# Patient Record
Sex: Male | Born: 1985 | Race: White | Hispanic: No | Marital: Single | Smoking: Current every day smoker
Health system: Southern US, Community
[De-identification: ages and names within clinical notes are randomized; demographics above are authoritative.]

## PROBLEM LIST (undated history)

## (undated) DIAGNOSIS — R42 Dizziness and giddiness: Secondary | ICD-10-CM

## (undated) DIAGNOSIS — G510 Bell's palsy: Secondary | ICD-10-CM

## (undated) HISTORY — DX: Dizziness and giddiness: R42

---
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
## 2004-03-11 ENCOUNTER — Emergency Department (HOSPITAL_COMMUNITY): Admission: EM | Admit: 2004-03-11 | Discharge: 2004-03-12 | Payer: Self-pay | Admitting: Emergency Medicine

## 2004-07-06 ENCOUNTER — Emergency Department (HOSPITAL_COMMUNITY): Admission: EM | Admit: 2004-07-06 | Discharge: 2004-07-06 | Payer: Self-pay | Admitting: Emergency Medicine

## 2004-11-01 ENCOUNTER — Emergency Department (HOSPITAL_COMMUNITY): Admission: EM | Admit: 2004-11-01 | Discharge: 2004-11-01 | Payer: Self-pay | Admitting: Emergency Medicine

## 2008-01-13 ENCOUNTER — Emergency Department (HOSPITAL_COMMUNITY): Admission: EM | Admit: 2008-01-13 | Discharge: 2008-01-13 | Payer: Self-pay | Admitting: Emergency Medicine

## 2008-03-29 ENCOUNTER — Emergency Department (HOSPITAL_COMMUNITY): Admission: EM | Admit: 2008-03-29 | Discharge: 2008-03-29 | Payer: Self-pay | Admitting: Emergency Medicine

## 2008-11-02 ENCOUNTER — Inpatient Hospital Stay (HOSPITAL_COMMUNITY): Admission: AC | Admit: 2008-11-02 | Discharge: 2008-11-03 | Payer: Self-pay

## 2008-11-06 ENCOUNTER — Encounter (INDEPENDENT_AMBULATORY_CARE_PROVIDER_SITE_OTHER): Payer: Self-pay | Admitting: Emergency Medicine

## 2008-11-06 ENCOUNTER — Ambulatory Visit: Payer: Self-pay | Admitting: Surgery

## 2008-11-06 ENCOUNTER — Emergency Department (HOSPITAL_COMMUNITY): Admission: EM | Admit: 2008-11-06 | Discharge: 2008-11-06 | Payer: Self-pay | Admitting: Emergency Medicine

## 2009-03-28 ENCOUNTER — Emergency Department (HOSPITAL_COMMUNITY): Admission: EM | Admit: 2009-03-28 | Discharge: 2009-03-29 | Payer: Self-pay | Admitting: Emergency Medicine

## 2010-05-08 ENCOUNTER — Emergency Department (HOSPITAL_COMMUNITY): Admission: EM | Admit: 2010-05-08 | Discharge: 2010-05-08 | Payer: Self-pay | Admitting: Emergency Medicine

## 2010-10-05 ENCOUNTER — Encounter: Payer: Self-pay | Admitting: Urology

## 2010-12-16 ENCOUNTER — Emergency Department (HOSPITAL_COMMUNITY)
Admission: EM | Admit: 2010-12-16 | Discharge: 2010-12-16 | Disposition: A | Discharge status: Home / Self Care | Payer: Self-pay | Attending: Emergency Medicine | Admitting: Emergency Medicine

## 2010-12-16 DIAGNOSIS — G510 Bell's palsy: Secondary | ICD-10-CM | POA: Insufficient documentation

## 2010-12-16 DIAGNOSIS — R29810 Facial weakness: Secondary | ICD-10-CM | POA: Insufficient documentation

## 2010-12-16 DIAGNOSIS — R209 Unspecified disturbances of skin sensation: Secondary | ICD-10-CM | POA: Insufficient documentation

## 2010-12-30 LAB — CBC
HCT: 42.6 % (ref 39.0–52.0)
HCT: 47.3 % (ref 39.0–52.0)
Hemoglobin: 14.6 g/dL (ref 13.0–17.0)
Hemoglobin: 14.9 g/dL (ref 13.0–17.0)
Hemoglobin: 16.5 g/dL (ref 13.0–17.0)
MCHC: 34.9 g/dL (ref 30.0–36.0)
MCHC: 35.1 g/dL (ref 30.0–36.0)
MCV: 91.2 fL (ref 78.0–100.0)
Platelets: 214 10*3/uL (ref 150–400)
RBC: 4.55 MIL/uL (ref 4.22–5.81)
RBC: 4.67 MIL/uL (ref 4.22–5.81)
RBC: 5.15 MIL/uL (ref 4.22–5.81)
RDW: 14.4 % (ref 11.5–15.5)
RDW: 14.6 % (ref 11.5–15.5)
WBC: 7.5 10*3/uL (ref 4.0–10.5)
WBC: 9.9 10*3/uL (ref 4.0–10.5)

## 2010-12-30 LAB — BASIC METABOLIC PANEL
CO2: 22 mEq/L (ref 19–32)
Calcium: 8.1 mg/dL — ABNORMAL LOW (ref 8.4–10.5)
Creatinine, Ser: 0.96 mg/dL (ref 0.4–1.5)
GFR calc non Af Amer: >60 mL/min (ref >60)
Glucose, Bld: 98 mg/dL (ref 70–99)

## 2010-12-30 LAB — PROTIME-INR: INR: 1.1 (ref 0.00–1.49)

## 2010-12-30 LAB — POCT I-STAT, CHEM 8
Creatinine, Ser: 1.1 mg/dL (ref 0.4–1.5)
Glucose, Bld: 100 mg/dL — ABNORMAL HIGH (ref 70–99)
HCT: 51 % (ref 39.0–52.0)
Hemoglobin: 17.3 g/dL — ABNORMAL HIGH (ref 13.0–17.0)
Sodium: 137 meq/L (ref 135–145)
TCO2: 22 mmol/L (ref 0–100)

## 2010-12-30 LAB — URINALYSIS, MICROSCOPIC ONLY
Bilirubin Urine: NEGATIVE
Glucose, UA: NEGATIVE mg/dL
Ketones, ur: NEGATIVE mg/dL
Leukocytes, UA: NEGATIVE
Nitrite: NEGATIVE
Specific Gravity, Urine: 1.027 (ref 1.005–1.030)
pH: 6.5 (ref 5.0–8.0)

## 2010-12-30 LAB — SAMPLE TO BLOOD BANK

## 2010-12-30 LAB — DIFFERENTIAL
Eosinophils Absolute: 0.1 10*3/uL (ref 0.0–0.7)
Lymphs Abs: 1.5 10*3/uL (ref 0.7–4.0)
Monocytes Absolute: 0.6 10*3/uL (ref 0.1–1.0)
Monocytes Relative: 8 % (ref 3–12)
Neutrophils Relative %: 69 % (ref 43–77)
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
## 2011-09-01 ENCOUNTER — Emergency Department (HOSPITAL_COMMUNITY)
Admission: EM | Admit: 2011-09-01 | Discharge: 2011-09-01 | Disposition: A | Discharge status: Home / Self Care | Payer: Self-pay | Attending: Emergency Medicine | Admitting: Emergency Medicine

## 2011-09-01 ENCOUNTER — Encounter: Payer: Self-pay | Admitting: *Deleted

## 2011-09-01 DIAGNOSIS — M549 Dorsalgia, unspecified: Secondary | ICD-10-CM | POA: Insufficient documentation

## 2011-09-01 DIAGNOSIS — S335XXA Sprain of ligaments of lumbar spine, initial encounter: Secondary | ICD-10-CM | POA: Insufficient documentation

## 2011-09-01 DIAGNOSIS — S39012A Strain of muscle, fascia and tendon of lower back, initial encounter: Secondary | ICD-10-CM

## 2011-09-01 DIAGNOSIS — Y93A2 Activity, calisthenics: Secondary | ICD-10-CM | POA: Insufficient documentation

## 2011-09-01 DIAGNOSIS — X503XXA Overexertion from repetitive movements, initial encounter: Secondary | ICD-10-CM | POA: Insufficient documentation

## 2011-09-01 HISTORY — DX: Bell's palsy: G51.0

## 2011-09-01 MED ORDER — CYCLOBENZAPRINE HCL 5 MG PO TABS: 5.0000 mg | ORAL_TABLET | Freq: Three times a day (TID) | ORAL | Status: AC | PRN

## 2011-09-01 NOTE — ED Notes (Signed)
Pt reports that he was doing sit ups this past Saturday and felt soreness in lower back. States that pain became worse and was unable to function until today. States pain is 6/10. States he has been taking aspirin and ibuprofen to alleviate the pain. States pain was 9/10 on Saturday, but now a 6/10.

## 2011-10-15 NOTE — ED Provider Notes (Signed)
History     CSN: [id number]  Arrival date & time 09/01/11  1542   None     Chief Complaint  Patient presents with  . Back Pain    (Consider location/radiation/quality/duration/timing/severity/associated sxs/prior treatment) Patient is a 26 y.o. male presenting with back pain. The history is provided by the patient.  Back Pain  This is a recurrent problem. The current episode started more than 2 days ago. The problem occurs constantly. The problem has been gradually improving. Associated with: He reports straining his back while doing vigorous sit ups last weekend. The pain is present in the lumbar spine. The quality of the pain is described as aching. The pain does not radiate. The pain is at a severity of 7/10. The pain is moderate. The symptoms are aggravated by bending and certain positions. Pertinent negatives include no chest pain, no fever, no numbness, no headaches, no abdominal pain, no bladder incontinence, no dysuria, no paresthesias, no paresis, no tingling and no weakness. He has tried NSAIDs and bed rest for the symptoms. The treatment provided mild relief.    Past Medical History  Diagnosis Date  . Bell's palsy     History reviewed. No pertinent past surgical history.  History reviewed. No pertinent family history.  History  Substance Use Topics  . Smoking status: Current Everyday Smoker -- 1.0 packs/day  . Smokeless tobacco: Never Used  . Alcohol Use: 5.4 oz/week    9 Cans of beer per week      Review of Systems  Constitutional: Negative for fever.  HENT: Negative for congestion, sore throat and neck pain.   Eyes: Negative.   Respiratory: Negative for chest tightness and shortness of breath.   Cardiovascular: Negative for chest pain.  Gastrointestinal: Negative for nausea and abdominal pain.  Genitourinary: Negative.  Negative for bladder incontinence and dysuria.  Musculoskeletal: Positive for back pain. Negative for joint swelling and arthralgias.    Skin: Negative.  Negative for rash and wound.  Neurological: Negative for dizziness, tingling, weakness, light-headedness, numbness, headaches and paresthesias.  Hematological: Negative.   Psychiatric/Behavioral: Negative.     Allergies  Review of patient's allergies indicates no known allergies.  Home Medications   Current Outpatient Rx  Name Route Sig Dispense Refill  . IBUPROFEN 200 MG PO TABS Oral Take 200 mg by mouth every 6 (six) hours as needed. PAIN       BP 124/71  Pulse 77  Temp(Src) 98.6 F (37 C) (Oral)  Resp 20  Ht 6' (1.829 m)  Wt 155 lb (70.308 kg)  BMI 21.02 kg/m2  SpO2 100%  Physical Exam  Nursing note and vitals reviewed. Constitutional: He is oriented to person, place, and time. He appears well-developed and well-nourished.  HENT:  Head: Normocephalic.  Eyes: Conjunctivae are normal.  Neck: Normal range of motion. Neck supple.  Cardiovascular: Regular rhythm and intact distal pulses.        Pedal pulses normal.  Pulmonary/Chest: Effort normal. He has no wheezes.  Abdominal: Soft. Bowel sounds are normal. He exhibits no distension and no mass.  Musculoskeletal: Normal range of motion. He exhibits no edema.       Lumbar back: He exhibits tenderness. He exhibits no swelling, no edema and no spasm.  Neurological: He is alert and oriented to person, place, and time. He has normal strength. He displays no atrophy and no tremor. No cranial nerve deficit or sensory deficit. Gait normal.  Reflex Scores:      Patellar reflexes are 2+  on the right side and 2+ on the left side.      Achilles reflexes are 2+ on the right side and 2+ on the left side.      No strength deficit noted in hip and knee flexor and extensor muscle groups.  Ankle flexion and extension intact.  Skin: Skin is warm and dry.  Psychiatric: He has a normal mood and affect.    ED Course  Procedures (including critical care time)  Labs Reviewed - No data to display No results found.   1.  Lumbar strain       MDM  No neuro deficit on exam or by history to suggest emergent or surgical presentation.           Candis Musa, PA 10/15/11 2143

## 2011-10-16 NOTE — ED Provider Notes (Signed)
Medical screening examination/treatment/procedure(s) were performed by non-physician practitioner and as supervising physician I was immediately available for consultation/collaboration.  Ethelda Chick, MD 10/16/11 [phone]

## 2012-02-09 IMAGING — CR DG KNEE 1-2V*L*
2 series · 2 of 2 positions shown · non-contrast
Comparison: None.

CLINICAL DATA: Generalized left knee pain with no known injury.

LEFT KNEE - 1-2 VIEW

[t knee ap left]
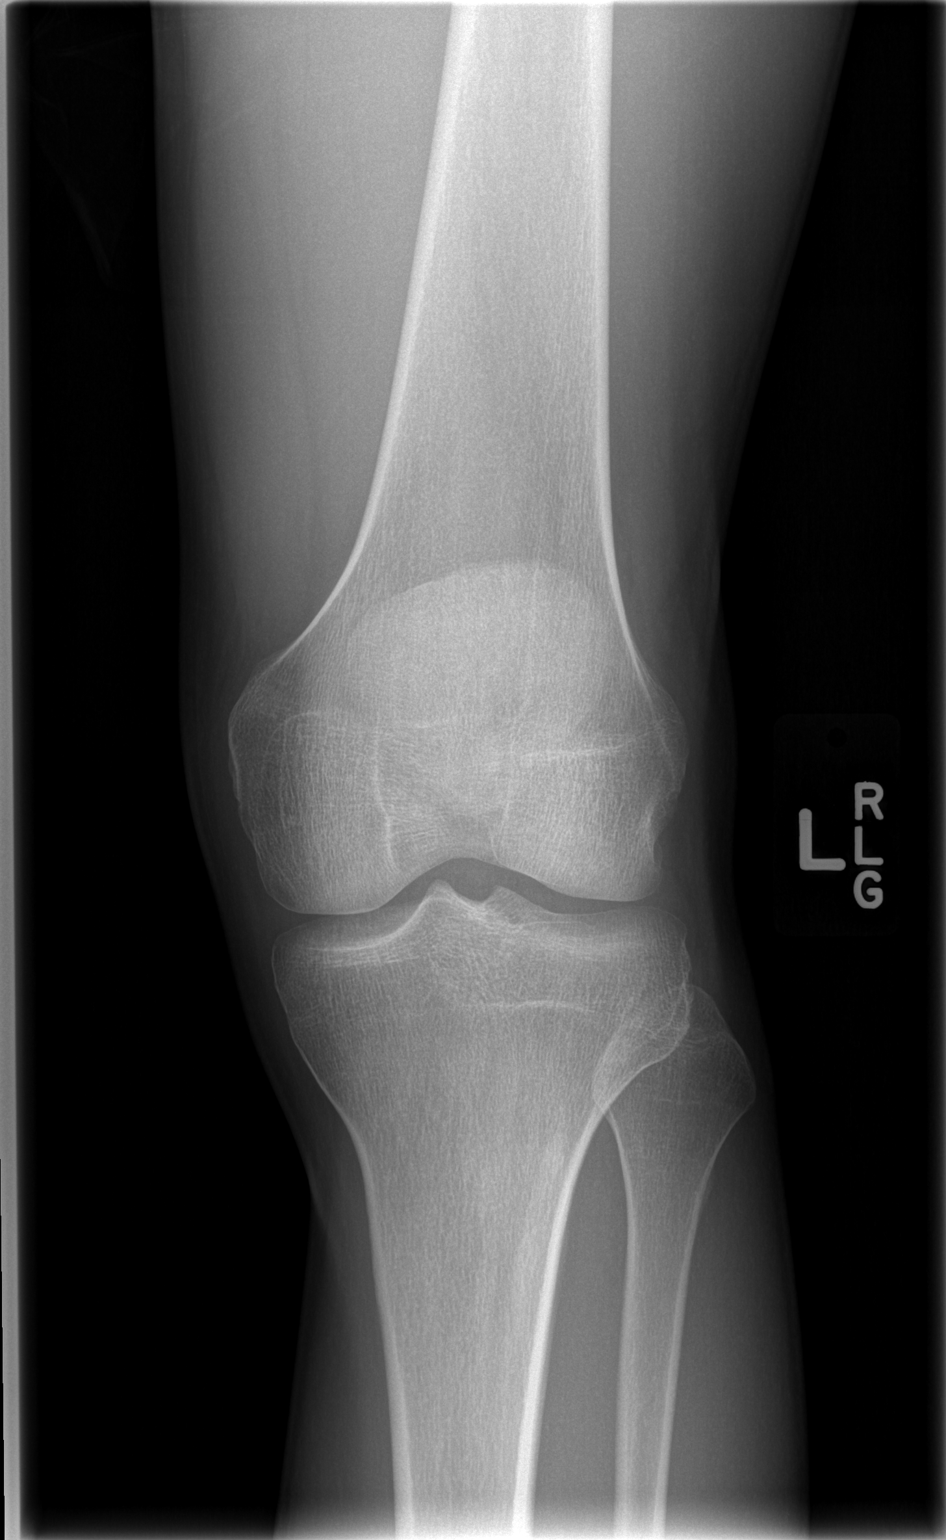

[t knee lat left]
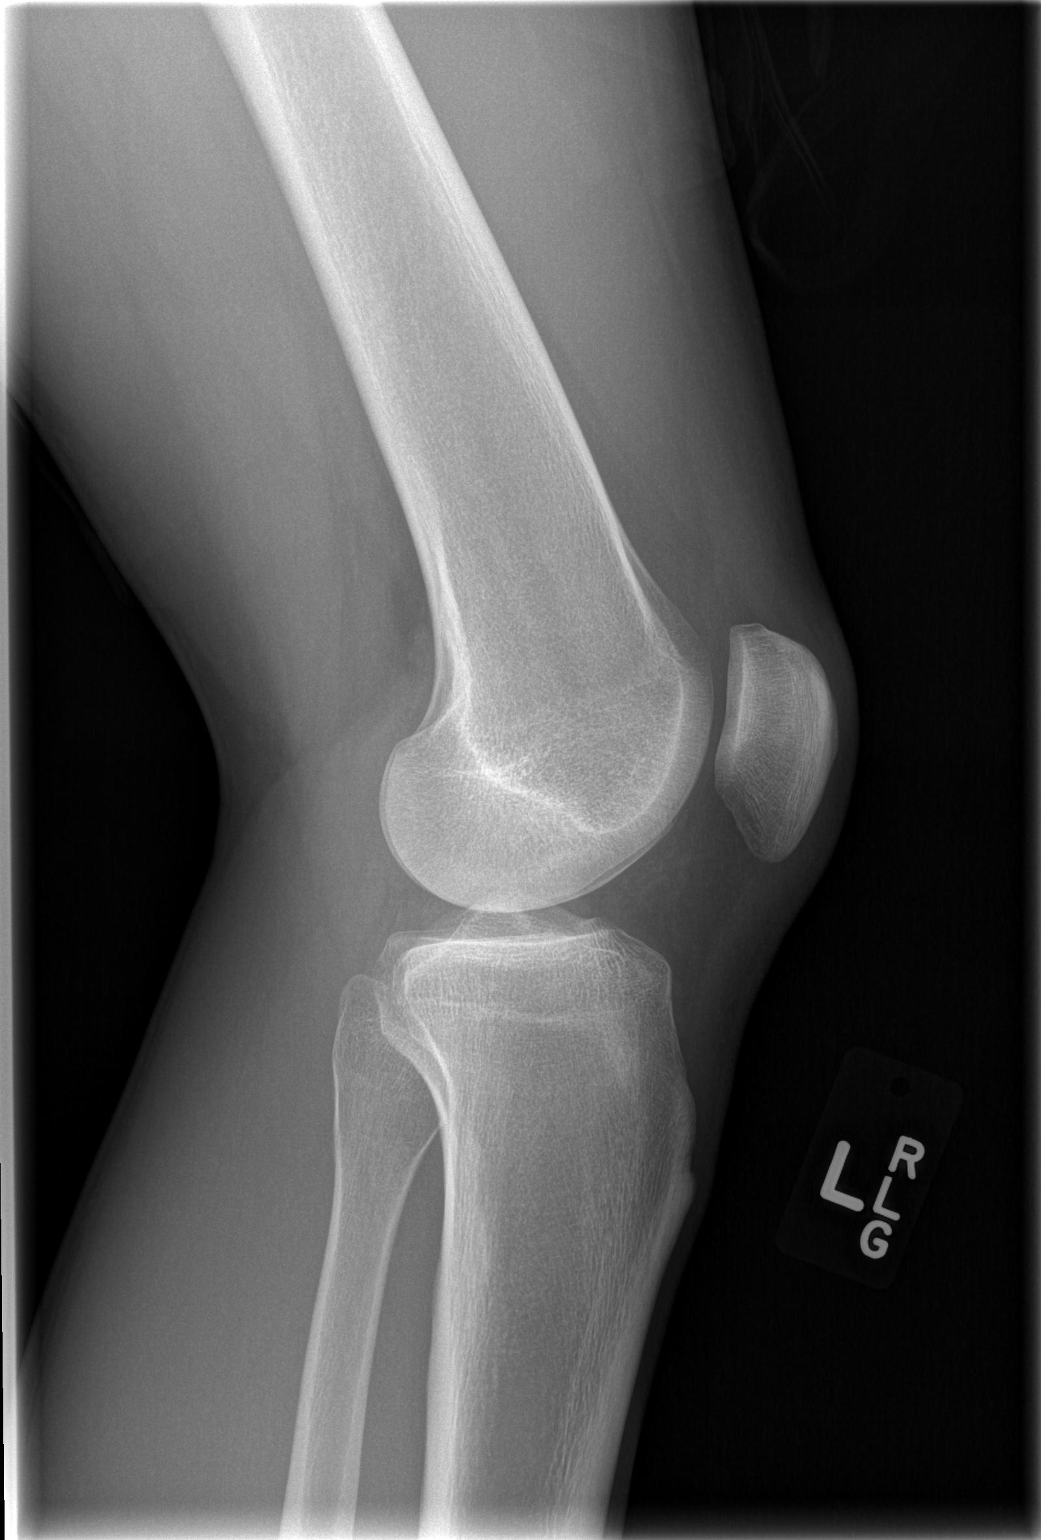

[2 of 2 positions shown; findings below may reference images not displayed]

FINDINGS: No joint effusion is evident. Alignment is normal.  Joint
spaces are preserved.  No fracture or dislocation is evident.  No
soft tissue lesions are seen.
IMPRESSION: No left knee lesion is evident.

## 2012-08-16 ENCOUNTER — Encounter (HOSPITAL_COMMUNITY): Payer: Self-pay | Admitting: *Deleted

## 2012-08-16 ENCOUNTER — Emergency Department (HOSPITAL_COMMUNITY)
Admission: EM | Admit: 2012-08-16 | Discharge: 2012-08-16 | Disposition: A | Discharge status: Home / Self Care | Payer: Self-pay | Attending: Emergency Medicine | Admitting: Emergency Medicine

## 2012-08-16 DIAGNOSIS — IMO0001 Reserved for inherently not codable concepts without codable children: Secondary | ICD-10-CM | POA: Insufficient documentation

## 2012-08-16 DIAGNOSIS — J3489 Other specified disorders of nose and nasal sinuses: Secondary | ICD-10-CM | POA: Insufficient documentation

## 2012-08-16 DIAGNOSIS — J329 Chronic sinusitis, unspecified: Secondary | ICD-10-CM | POA: Insufficient documentation

## 2012-08-16 DIAGNOSIS — Z8669 Personal history of other diseases of the nervous system and sense organs: Secondary | ICD-10-CM | POA: Insufficient documentation

## 2012-08-16 DIAGNOSIS — F17200 Nicotine dependence, unspecified, uncomplicated: Secondary | ICD-10-CM | POA: Insufficient documentation

## 2012-08-16 DIAGNOSIS — H5710 Ocular pain, unspecified eye: Secondary | ICD-10-CM | POA: Insufficient documentation

## 2012-08-16 DIAGNOSIS — R42 Dizziness and giddiness: Secondary | ICD-10-CM | POA: Insufficient documentation

## 2012-08-16 MED ORDER — PHENYLEPHRINE HCL 10 MG PO TABS: 10.0000 mg | ORAL_TABLET | ORAL | Status: DC | PRN

## 2012-08-16 MED ORDER — AMOXICILLIN-POT CLAVULANATE 500-125 MG PO TABS: ORAL_TABLET | ORAL | Status: DC

## 2012-08-16 NOTE — ED Notes (Signed)
Pt with weakness and occ. Dizziness since Saturday, Sunday night began to have facial pressure and nasal congestion, denies cough, denies fever

## 2012-08-16 NOTE — ED Notes (Signed)
Sinus congestion, no sore throat, facial pain, dizzy earlier in week.

## 2012-08-16 NOTE — ED Provider Notes (Signed)
History     CSN: [id number]  Arrival date & time 08/16/12  1352   First MD Initiated Contact with Patient 08/16/12 1434      Chief Complaint  Patient presents with  . Nasal Congestion    (Consider location/radiation/quality/duration/timing/severity/associated sxs/prior treatment) Patient is a 26 y.o. male presenting with URI. The history is provided by the patient.  URI The primary symptoms include headaches and myalgias. Primary symptoms do not include fever, fatigue, ear pain, sore throat, swollen glands, cough, wheezing, abdominal pain, nausea, vomiting, arthralgias or rash. Primary symptoms comment: nasal congestion , sinus pressure The current episode started 3 to 5 days ago. This is a new problem. The problem has been gradually worsening.  The headache began more than 2 days ago. The headache developed gradually. Headache is a new problem. The headache is present intermittently. The headache is associated with eye pain. The headache is not associated with photophobia, double vision, decreased vision, stiff neck, neck stiffness, paresthesias, weakness or loss of balance.  The myalgias are not associated with weakness.  Symptoms associated with the illness include facial pain, sinus pressure and congestion. The illness is not associated with chills or rhinorrhea. Associated symptoms comments: Intermittent dizziness.    Past Medical History  Diagnosis Date  . Bell's palsy     History reviewed. No pertinent past surgical history.  History reviewed. No pertinent family history.  History  Substance Use Topics  . Smoking status: Current Every Day Smoker -- 1.0 packs/day  . Smokeless tobacco: Never Used  . Alcohol Use: 5.4 oz/week    9 Cans of beer per week      Review of Systems  Constitutional: Negative for fever, chills, activity change, appetite change and fatigue.  HENT: Positive for congestion and sinus pressure. Negative for ear pain, sore throat, facial swelling,  rhinorrhea, trouble swallowing, neck pain, neck stiffness and dental problem.   Eyes: Positive for pain. Negative for double vision, photophobia and visual disturbance.  Respiratory: Negative for cough, chest tightness and wheezing.   Gastrointestinal: Negative for nausea, vomiting and abdominal pain.  Musculoskeletal: Positive for myalgias. Negative for arthralgias.  Skin: Negative for rash.  Neurological: Positive for dizziness and headaches. Negative for seizures, facial asymmetry, speech difficulty, weakness, numbness, paresthesias and loss of balance.  Hematological: Negative for adenopathy.  Psychiatric/Behavioral: Negative for confusion.  All other systems reviewed and are negative.    Allergies  Review of patient's allergies indicates no known allergies.  Home Medications   Current Outpatient Rx  Name  Route  Sig  Dispense  Refill  . IBUPROFEN 200 MG PO TABS   Oral   Take 200 mg by mouth every 6 (six) hours as needed. PAIN            BP 131/78  Pulse 71  Temp 97.9 F (36.6 C) (Oral)  Resp 18  Ht 6' (1.829 m)  Wt 155 lb (70.308 kg)  BMI 21.02 kg/m2  SpO2 100%  Physical Exam  Nursing note and vitals reviewed. Constitutional: He is oriented to person, place, and time. He appears well-developed and well-nourished. No distress.  HENT:  Head: Normocephalic and atraumatic.  Right Ear: Ear canal normal. No drainage or swelling. No mastoid tenderness. Tympanic membrane mobility is abnormal. No hemotympanum.  Left Ear: Tympanic membrane and ear canal normal.  Nose: Mucosal edema present. Right sinus exhibits maxillary sinus tenderness and frontal sinus tenderness. Left sinus exhibits maxillary sinus tenderness and frontal sinus tenderness.  Mouth/Throat: Uvula is midline, oropharynx  is clear and moist and mucous membranes are normal.       Fluid behind right TM, no erythema or bulging  Neck: Normal range of motion. Neck supple.  Cardiovascular: Normal rate, regular  rhythm, normal heart sounds and intact distal pulses.   No murmur heard. Pulmonary/Chest: Effort normal and breath sounds normal.  Musculoskeletal: Normal range of motion.  Lymphadenopathy:    He has no cervical adenopathy.  Neurological: He is alert and oriented to person, place, and time. He exhibits normal muscle tone. Coordination normal.  Skin: Skin is warm and dry.    ED Course  Procedures (including critical care time)  Labs Reviewed - No data to display No results found.      MDM     Patient is alert. Nontoxic appearing. No meningeal signs.  No focal neuro deficits on exam. Vital signs are stable. symptoms are likely related to sinusitis. I will treat with decongestants and antibiotics   Patient agrees to close followup with his primary care physician or to return here if symptoms worsen.  Prescribe: Sudafed Augmentin  George Rice L. [place], [place] 08/17/12 2017

## 2012-08-19 NOTE — ED Provider Notes (Signed)
Medical screening examination/treatment/procedure(s) were performed by non-physician practitioner and as supervising physician I was immediately available for consultation/collaboration. Nakita Santerre, MD, FACEP   Sincerity Cedar L Vylet Maffia, MD 08/19/12 2116 

## 2013-07-17 ENCOUNTER — Encounter (HOSPITAL_COMMUNITY): Payer: Self-pay | Admitting: Emergency Medicine

## 2013-07-17 ENCOUNTER — Emergency Department (HOSPITAL_COMMUNITY)
Admission: EM | Admit: 2013-07-17 | Discharge: 2013-07-17 | Disposition: A | Discharge status: Home / Self Care | Payer: Self-pay | Attending: Emergency Medicine | Admitting: Emergency Medicine

## 2013-07-17 DIAGNOSIS — J029 Acute pharyngitis, unspecified: Secondary | ICD-10-CM | POA: Insufficient documentation

## 2013-07-17 DIAGNOSIS — IMO0001 Reserved for inherently not codable concepts without codable children: Secondary | ICD-10-CM | POA: Insufficient documentation

## 2013-07-17 DIAGNOSIS — F17200 Nicotine dependence, unspecified, uncomplicated: Secondary | ICD-10-CM | POA: Insufficient documentation

## 2013-07-17 DIAGNOSIS — R6883 Chills (without fever): Secondary | ICD-10-CM | POA: Insufficient documentation

## 2013-07-17 DIAGNOSIS — Z792 Long term (current) use of antibiotics: Secondary | ICD-10-CM | POA: Insufficient documentation

## 2013-07-17 DIAGNOSIS — Z8669 Personal history of other diseases of the nervous system and sense organs: Secondary | ICD-10-CM | POA: Insufficient documentation

## 2013-07-17 MED ORDER — AMOXICILLIN 250 MG PO CAPS
500.0000 mg | ORAL_CAPSULE | Freq: Once | ORAL | Status: AC
  Administered 2013-07-17: 500 mg via ORAL
  Filled 2013-07-17: qty 2

## 2013-07-17 MED ORDER — HYDROCODONE-ACETAMINOPHEN 7.5-325 MG/15ML PO SOLN: 10.0000 mL | Freq: Four times a day (QID) | ORAL | Status: DC | PRN

## 2013-07-17 MED ORDER — AMOXICILLIN 500 MG PO CAPS: 500.0000 mg | ORAL_CAPSULE | Freq: Three times a day (TID) | ORAL | Status: DC

## 2013-07-17 NOTE — ED Notes (Signed)
Pt c/o sorethroat x 2 days. Tried otc meds with no relief. Denies fevers. Nad. Throat red with moderate swelling and white patches.

## 2013-07-19 NOTE — ED Provider Notes (Signed)
CSN: [id number]     Arrival date & time 07/17/13  7829 History   First MD Initiated Contact with Patient 07/17/13 1005     Chief Complaint  Patient presents with  . Sore Throat   (Consider location/radiation/quality/duration/timing/severity/associated sxs/prior Treatment) Patient is a 27 y.o. male presenting with pharyngitis. The history is provided by the patient.  Sore Throat This is a new problem. Episode onset: 2 days. The problem occurs constantly. The problem has been unchanged. Associated symptoms include chills, myalgias and a sore throat. Pertinent negatives include no abdominal pain, arthralgias, chest pain, congestion, coughing, fever, headaches, nausea, neck pain, numbness, rash, swollen glands, vomiting or weakness. The symptoms are aggravated by swallowing. He has tried NSAIDs for the symptoms. The treatment provided mild relief.    Past Medical History  Diagnosis Date  . Bell's palsy    History reviewed. No pertinent past surgical history. History reviewed. No pertinent family history. History  Substance Use Topics  . Smoking status: Current Every Day Smoker -- 1.00 packs/day  . Smokeless tobacco: Never Used  . Alcohol Use: 0.0 oz/week     Comment: beer twice a week    Review of Systems  Constitutional: Positive for chills. Negative for fever, activity change and appetite change.  HENT: Positive for sore throat. Negative for congestion, ear pain, facial swelling, trouble swallowing and voice change.   Eyes: Negative for pain and visual disturbance.  Respiratory: Negative for cough and shortness of breath.   Cardiovascular: Negative for chest pain.  Gastrointestinal: Negative for nausea, vomiting and abdominal pain.  Musculoskeletal: Positive for myalgias. Negative for arthralgias, neck pain and neck stiffness.  Skin: Negative for color change and rash.  Neurological: Negative for dizziness, facial asymmetry, speech difficulty, weakness, numbness and headaches.   Hematological: Negative for adenopathy.  All other systems reviewed and are negative.    Allergies  Poison sumac extract  Home Medications   Current Outpatient Rx  Name  Route  Sig  Dispense  Refill  . ibuprofen (ADVIL,MOTRIN) 200 MG tablet   Oral   Take 200 mg by mouth every 6 (six) hours as needed. PAIN          . Phenylephrine-DM-GG-APAP (TYLENOL COLD/FLU SEVERE) 5-10-200-325 MG TABS   Oral   Take 2 tablets by mouth once as needed. For symtpoms         . amoxicillin (AMOXIL) 500 MG capsule   Oral   Take 1 capsule (500 mg total) by mouth 3 (three) times daily. For 10 days   30 capsule   0   . HYDROcodone-acetaminophen (HYCET) 7.5-325 mg/15 ml solution   Oral   Take 10 mLs by mouth every 6 (six) hours as needed for pain.   100 mL   0    BP 117/69  Pulse 79  Temp(Src) 97.9 F (36.6 C) (Oral)  Resp 19  SpO2 100% Physical Exam  Nursing note and vitals reviewed. Constitutional: He is oriented to person, place, and time. He appears well-developed and well-nourished. No distress.  HENT:  Head: Normocephalic and atraumatic.  Right Ear: Tympanic membrane and ear canal normal.  Left Ear: Tympanic membrane and ear canal normal.  Mouth/Throat: Uvula is midline and mucous membranes are normal. No trismus in the jaw. No uvula swelling. Oropharyngeal exudate, posterior oropharyngeal edema and posterior oropharyngeal erythema present. No tonsillar abscesses.  Neck: Normal range of motion and phonation normal. Neck supple.  Cardiovascular: Normal rate, regular rhythm and normal heart sounds.   Pulmonary/Chest: Effort normal  and breath sounds normal.  Abdominal: There is no splenomegaly. There is no tenderness.  Musculoskeletal: Normal range of motion.  Lymphadenopathy:    He has cervical adenopathy.       Right cervical: Superficial cervical adenopathy present.       Left cervical: Superficial cervical adenopathy present.  Neurological: He is alert and oriented to  person, place, and time. He exhibits normal muscle tone. Coordination normal.  Skin: Skin is warm and dry.    ED Course  Procedures (including critical care time) Labs Review Labs Reviewed - No data to display Imaging Review No results found.  EKG Interpretation   None       MDM   1. Pharyngitis    Pt non-toxic appearing, handles secretions well.  No fever, rash, abd pain or vomiting.  No PTA.  Pt agrees to fluids, amoxil and hycet syrup.  Agrees to f/u with PMD or to return here if needed   Witten Certain L. Trisha Mangle, PA-C 07/19/13 2158

## 2013-07-20 NOTE — ED Provider Notes (Signed)
Medical screening examination/treatment/procedure(s) were performed by non-physician practitioner and as supervising physician I was immediately available for consultation/collaboration.  EKG Interpretation   None         Shelda Jakes, MD 07/20/13 [phone]

## 2014-12-26 ENCOUNTER — Emergency Department (HOSPITAL_COMMUNITY)
Admission: EM | Admit: 2014-12-26 | Discharge: 2014-12-26 | Disposition: A | Discharge status: Home / Self Care | Payer: Self-pay | Source: Home / Self Care | Attending: Emergency Medicine | Admitting: Emergency Medicine

## 2014-12-26 ENCOUNTER — Encounter (HOSPITAL_COMMUNITY): Payer: Self-pay | Admitting: *Deleted

## 2014-12-26 DIAGNOSIS — G510 Bell's palsy: Secondary | ICD-10-CM

## 2014-12-26 MED ORDER — VALACYCLOVIR HCL 1 G PO TABS: 1000.0000 mg | ORAL_TABLET | Freq: Three times a day (TID) | ORAL | Status: AC

## 2014-12-26 MED ORDER — PREDNISONE 10 MG PO TABS: ORAL_TABLET | ORAL | Status: DC

## 2014-12-26 NOTE — Discharge Instructions (Signed)
Bell's Palsy °Bell's palsy is a condition in which the muscles on one side of the face cannot move (paralysis). This is because the nerves in the face are paralyzed. It is most often thought to be caused by a virus. The virus causes swelling of the nerve that controls movement on one side of the face. The nerve travels through a tight space surrounded by bone. When the nerve swells, it can be compressed by the bone. This results in damage to the protective covering around the nerve. This damage interferes with how the nerve communicates with the muscles of the face. As a result, it can cause weakness or paralysis of the facial muscles.  °Injury (trauma), tumor, and surgery may cause Bell's palsy, but most of the time the cause is unknown. It is a relatively common condition. It starts suddenly (abrupt onset) with the paralysis usually ending within 2 days. Bell's palsy is not dangerous. But because the eye does not close properly, you may need care to keep the eye from getting dry. This can include splinting (to keep the eye shut) or moistening with artificial tears. Bell's palsy very seldom occurs on both sides of the face at the same time. °SYMPTOMS  °· Eyebrow sagging. °· Drooping of the eyelid and corner of the mouth. °· Inability to close one eye. °· Loss of taste on the front of the tongue. °· Sensitivity to loud noises. °TREATMENT  °The treatment is usually non-surgical. If the patient is seen within the first 24 to 48 hours, a short course of steroids may be prescribed, in an attempt to shorten the length of the condition. Antiviral medicines may also be used with the steroids, but it is unclear if they are helpful.  °You will need to protect your eye, if you cannot close it. The cornea (clear covering over your eye) will become dry and can be damaged. Artificial tears can be used to keep your eye moist. Glasses or an eye patch should be worn to protect your eye. °PROGNOSIS  °Recovery is variable, ranging  from days to months. Although the problem usually goes away completely (about 80% of cases resolve), predicting the outcome is impossible. Most people improve within 3 weeks of when the symptoms began. Improvement may continue for 3 to 6 months. A small number of people have moderate to severe weakness that is permanent.  °HOME CARE INSTRUCTIONS  °· If your caregiver prescribed medication to reduce swelling in the nerve, use as directed. Do not stop taking the medication unless directed by your caregiver. °· Use moisturizing eye drops as needed to prevent drying of your eye, as directed by your caregiver. °· Protect your eye, as directed by your caregiver. °· Use facial massage and exercises, as directed by your caregiver. °· Perform your normal activities, and get your normal rest. °SEEK IMMEDIATE MEDICAL CARE IF:  °· There is pain, redness or irritation in the eye. °· You or your child has an oral temperature above 102° F (38.9° C), not controlled by medicine. °MAKE SURE YOU:  °· Understand these instructions. °· Will watch your condition. °· Will get help right away if you are not doing well or get worse. °Document Released: 08/31/2005 Document Revised: 11/23/2011 Document Reviewed: 12/08/2013 °[employer]® Patient Information ©2015 [employer]. This information is not intended to replace advice given to you by your health care provider. Make sure you discuss any questions you have with your health care provider. ° °

## 2014-12-26 NOTE — ED Provider Notes (Signed)
CSN: [id number]     Arrival date & time 12/26/14  1037 History   First MD Initiated Contact with Patient 12/26/14 1049     Chief Complaint  Patient presents with  . 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 States first episode was about 6 years ago, second episode was about 3 years ago and current episode began 2 days ago. States both current and previous episodes affect right side of face. Denies recent illness or injury. Denies pain. Is only experiencing mild right facial skin hypersensitivity and mild right sided increased hearing sensitivity with regard to loud noises. Does not recall medications used for treatment in the past. No fevers. No facial rash. No eye pain or vision changes.   The history is provided by the patient.    Past Medical History  Diagnosis Date  . Bell's palsy    History reviewed. No pertinent past surgical history. History reviewed. No pertinent family history. History  Substance Use Topics  . Smoking status: Current Every Day Smoker -- 1.00 packs/day  . Smokeless tobacco: Never Used  . Alcohol Use: 0.0 oz/week     Comment: beer twice a week    Review of Systems  All other systems reviewed and are negative.   Allergies  Poison sumac extract  Home Medications   Prior to Admission medications   Medication Sig Start Date End Date Taking? Authorizing Provider  amoxicillin (AMOXIL) 500 MG capsule Take 1 capsule (500 mg total) by mouth 3 (three) times daily. For 10 days 07/17/13   Severiano Gilbertammi Triplett, PA-C  HYDROcodone-acetaminophen (HYCET) 7.5-325 mg/15 ml solution Take 10 mLs by mouth every 6 (six) hours as needed for pain. 07/17/13   Tammi Triplett, PA-C  ibuprofen (ADVIL,MOTRIN) 200 MG tablet Take 200 mg by mouth every 6 (six) hours as needed. PAIN     Historical Provider, MD   Phenylephrine-DM-GG-APAP (TYLENOL COLD/FLU SEVERE) 5-10-200-325 MG TABS Take 2 tablets by mouth once as needed. For symtpoms    Historical Provider, MD  predniSONE (DELTASONE) 10 MG tablet 6 tabs po QD x days 1-7. Then 5 tabs po QD day 8, 4 tabs po QD day 9, 3 tabs po QD day 10, 2 tabs po QD day 11, 1 tab po QD day 12 then stop. 12/26/14   Mathis FareJennifer Lee H Taimane Stimmel, PA  valACYclovir (VALTREX) 1000 MG tablet Take 1 tablet (1,000 mg total) by mouth 3 (three) times daily. X 7 days 12/26/14 01/09/15  Jess BartersJennifer Lee H Jhett Fretwell, PA   BP 114/72 mmHg  Pulse 77  Temp(Src) 97.5 F (36.4 C) (Oral)  Resp 12  SpO2 100% Physical Exam  Constitutional: He is oriented to person, place, and time. He appears well-developed and well-nourished. No distress.  HENT:  Head: Normocephalic and atraumatic.  Right Ear: Hearing, tympanic membrane, external ear and ear canal normal.  Left Ear: Hearing, tympanic membrane, external ear and ear canal normal.  Nose: Nose normal.  Mouth/Throat: Uvula is midline, oropharynx is clear and moist and mucous membranes are normal. No oral lesions. No trismus in the jaw. No uvula swelling.  Eyes: Conjunctivae and EOM are normal. Pupils are equal, round, and reactive to light. Right eye exhibits no discharge. Left eye exhibits discharge.  Neck: Normal range of motion. Neck supple.  Cardiovascular: Normal rate, regular rhythm and normal heart sounds.   Pulmonary/Chest: Effort normal and breath sounds normal.  Musculoskeletal:  See  neuro  Lymphadenopathy:    He has no cervical adenopathy.  Neurological: He is alert and oriented to person, place, and time. No sensory deficit. Coordination and gait normal. GCS eye subscore is 4. GCS verbal subscore is 5. GCS motor subscore is 6.  Right facial nerve dysfunction with associated right upper eyelid ptosis. Unable to raise right eyebrow, puff out right cheek or create symmetrical smile or frown. +Droop of corner of right mouth. Tongue protrudes  midline  Skin: Skin is warm and dry.  Psychiatric: He has a normal mood and affect. His behavior is normal.  Nursing note and vitals reviewed.   ED Course  Procedures (including critical care time) Labs Review Labs Reviewed - No data to display  Imaging Review No results found.   MDM   1. 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 Advised to wear earplugs to guard against phonophobia and to patch and/or protect right eye at night and while at work given ptosis caused by Bell's Palsy. Encouraged to establish care with PCP and/or neurology should symptoms persist.      Ria Clock, PA 12/26/14 1210

## 2014-12-26 NOTE — ED Notes (Signed)
Pt  States  r  Side  Face   Not   Working  Well   - has  Had  Bells  Palsy in past  Unable  To  Raise  Eyebrow  And  Or  Smile   - only  Slight pain        Airway is  Intact    Awake  And alert  And  Oriented       Speech intact  Hand  Grips  Are  Strong

## 2015-09-10 ENCOUNTER — Emergency Department (HOSPITAL_COMMUNITY)
Admission: EM | Admit: 2015-09-10 | Discharge: 2015-09-11 | Disposition: A | Discharge status: Home / Self Care | Payer: Self-pay | Attending: Emergency Medicine | Admitting: Emergency Medicine

## 2015-09-10 DIAGNOSIS — R51 Headache: Secondary | ICD-10-CM | POA: Insufficient documentation

## 2015-09-10 DIAGNOSIS — Z79899 Other long term (current) drug therapy: Secondary | ICD-10-CM | POA: Insufficient documentation

## 2015-09-10 DIAGNOSIS — R42 Dizziness and giddiness: Secondary | ICD-10-CM | POA: Insufficient documentation

## 2015-09-10 DIAGNOSIS — Z8669 Personal history of other diseases of the nervous system and sense organs: Secondary | ICD-10-CM | POA: Insufficient documentation

## 2015-09-10 DIAGNOSIS — J069 Acute upper respiratory infection, unspecified: Secondary | ICD-10-CM | POA: Insufficient documentation

## 2015-09-10 DIAGNOSIS — R5383 Other fatigue: Secondary | ICD-10-CM | POA: Insufficient documentation

## 2015-09-10 DIAGNOSIS — F17210 Nicotine dependence, cigarettes, uncomplicated: Secondary | ICD-10-CM | POA: Insufficient documentation

## 2015-09-11 ENCOUNTER — Emergency Department (HOSPITAL_COMMUNITY): Payer: Self-pay

## 2015-09-11 ENCOUNTER — Encounter (HOSPITAL_COMMUNITY): Payer: Self-pay | Admitting: Family Medicine

## 2015-09-11 LAB — URINALYSIS, ROUTINE W REFLEX MICROSCOPIC
Bilirubin Urine: NEGATIVE
GLUCOSE, UA: NEGATIVE mg/dL
Hgb urine dipstick: NEGATIVE
KETONES UR: NEGATIVE mg/dL
LEUKOCYTES UA: NEGATIVE
Nitrite: NEGATIVE
PH: 6.5 (ref 5.0–8.0)
Protein, ur: NEGATIVE mg/dL
SPECIFIC GRAVITY, URINE: 1.022 (ref 1.005–1.030)

## 2015-09-11 LAB — BASIC METABOLIC PANEL
Anion gap: 9 (ref 5–15)
BUN: 12 mg/dL (ref 6–20)
CHLORIDE: 103 mmol/L (ref 101–111)
CO2: 30 mmol/L (ref 22–32)
Calcium: 9.3 mg/dL (ref 8.9–10.3)
Creatinine, Ser: 0.84 mg/dL (ref 0.61–1.24)
GFR calc Af Amer: >60 mL/min (ref >60)
GFR calc non Af Amer: >60 mL/min (ref >60)
Glucose, Bld: 95 mg/dL (ref 65–99)
POTASSIUM: 4 mmol/L (ref 3.5–5.1)
SODIUM: 142 mmol/L (ref 135–145)

## 2015-09-11 LAB — CBC
HEMATOCRIT: 45.9 % (ref 39.0–52.0)
HEMOGLOBIN: 15.6 g/dL (ref 13.0–17.0)
MCH: 30.4 pg (ref 26.0–34.0)
MCHC: 34 g/dL (ref 30.0–36.0)
MCV: 89.5 fL (ref 78.0–100.0)
Platelets: 244 10*3/uL (ref 150–400)
RBC: 5.13 MIL/uL (ref 4.22–5.81)
RDW: 13.5 % (ref 11.5–15.5)
WBC: 11.2 10*3/uL — AB (ref 4.0–10.5)

## 2015-09-11 MED ORDER — BENZONATATE 100 MG PO CAPS: 100.0000 mg | ORAL_CAPSULE | Freq: Three times a day (TID) | ORAL | Status: DC

## 2015-09-11 MED ORDER — SODIUM CHLORIDE 0.9 % IV BOLUS (SEPSIS)
1000.0000 mL | Freq: Once | INTRAVENOUS | Status: AC
  Administered 2015-09-11: 1000 mL via INTRAVENOUS

## 2015-09-11 NOTE — ED Provider Notes (Signed)
CSN: [id number]     Arrival date & time 09/10/15  2322 History   First MD Initiated Contact with Patient 09/11/15 0449     Chief Complaint  Patient presents with  . Dizziness     (Consider location/radiation/quality/duration/timing/severity/associated sxs/prior Treatment) HPI Comments: Over one week of coughing, nasal congestion, nose white discharge clear, coughing up yellow No sore throat Dizziness, lightheadedness Yesterday, comes and goes Worse with driving, moving Slow reaction times with driving, fatigue Headaches every now and then in temples, mild, no current headache however sensitive to noises No n/v/diarrhea/black or bloody stool No fevers   Patient is a 29 y.o. male presenting with dizziness.  Dizziness Associated symptoms: headaches   Associated symptoms: no blood in stool, no chest pain, no diarrhea, no nausea, no shortness of breath (every now and then), no vomiting and no weakness     Past Medical History  Diagnosis Date  . Bell's palsy    History reviewed. No pertinent past surgical history. History reviewed. No pertinent family history. Social History  Substance Use Topics  . Smoking status: Current Every Day Smoker -- 1.00 packs/day    Types: Cigarettes  . Smokeless tobacco: Never Used  . Alcohol Use: 0.0 oz/week     Comment: One every month.     Review of Systems  Constitutional: Positive for fatigue. Negative for fever.  HENT: Positive for congestion. Negative for ear pain and sore throat.   Eyes: Negative for visual disturbance.  Respiratory: Positive for cough. Negative for shortness of breath (every now and then).   Cardiovascular: Negative for chest pain.  Gastrointestinal: Negative for nausea, vomiting, abdominal pain, diarrhea and blood in stool.  Genitourinary: Negative for difficulty urinating.  Musculoskeletal: Negative for back pain and neck stiffness.  Skin: Negative for rash.  Neurological: Positive for dizziness, light-headedness  and headaches. Negative for syncope, speech difficulty, weakness and numbness.      Allergies  Poison sumac extract  Home Medications   Prior to Admission medications   Medication Sig Start Date End Date Taking? Authorizing Provider  DM-Doxylamine-Acetaminophen (NYQUIL COLD & FLU PO) Take 1 capsule by mouth at bedtime.   Yes Historical Provider, MD  ibuprofen (ADVIL,MOTRIN) 200 MG tablet Take 200 mg by mouth every 6 (six) hours as needed. PAIN    Yes Historical Provider, MD  Pseudoephedrine-APAP-DM (DAYQUIL MULTI-SYMPTOM COLD/FLU PO) Take 1 capsule by mouth every 6 (six) hours.   Yes Historical Provider, MD  amoxicillin (AMOXIL) 500 MG capsule Take 1 capsule (500 mg total) by mouth 3 (three) times daily. For 10 days Patient not taking: Reported on 09/10/2015 07/17/13   Tammy Triplett, PA-C  benzonatate (TESSALON) 100 MG capsule Take 1 capsule (100 mg total) by mouth every 8 (eight) hours. 09/11/15   Alvira Monday, MD  HYDROcodone-acetaminophen (HYCET) 7.5-325 mg/15 ml solution Take 10 mLs by mouth every 6 (six) hours as needed for pain. Patient not taking: Reported on 09/10/2015 07/17/13   Tammy Triplett, PA-C  predniSONE (DELTASONE) 10 MG tablet 6 tabs po QD x days 1-7. Then 5 tabs po QD day 8, 4 tabs po QD day 9, 3 tabs po QD day 10, 2 tabs po QD day 11, 1 tab po QD day 12 then stop. Patient not taking: Reported on 09/10/2015 12/26/14   Mathis Fare Presson, PA   BP 119/78 mmHg  Pulse 69  Temp(Src) 98.2 F (36.8 C) (Oral)  Resp 18  Ht 6' (1.829 m)  Wt 155 lb (70.308 kg)  BMI 21.02  kg/m2  SpO2 100% Physical Exam  Constitutional: He is oriented to person, place, and time. He appears well-developed and well-nourished. No distress.  HENT:  Head: Normocephalic and atraumatic.  Mouth/Throat: Oropharynx is clear and moist. No oropharyngeal exudate.  TM WNL  Eyes: Conjunctivae and EOM are normal. Pupils are equal, round, and reactive to light.  Neck: Normal range of motion.   Cardiovascular: Normal rate, regular rhythm, normal heart sounds and intact distal pulses.  Exam reveals no gallop and no friction rub.   No murmur heard. Pulmonary/Chest: Effort normal and breath sounds normal. No respiratory distress. He has no wheezes. He has no rales.  Abdominal: Soft. He exhibits no distension. There is no tenderness. There is no guarding.  Musculoskeletal: He exhibits no edema.  Neurological: He is alert and oriented to person, place, and time. He has normal strength. No cranial nerve deficit or sensory deficit. Coordination normal. GCS eye subscore is 4. GCS verbal subscore is 5. GCS motor subscore is 6.  Skin: Skin is warm and dry. He is not diaphoretic.  Nursing note and vitals reviewed.   ED Course  Procedures (including critical care time) Labs Review Labs Reviewed  CBC - Abnormal; Notable for the following:    WBC 11.2 (*)    All other components within normal limits  BASIC METABOLIC PANEL  URINALYSIS, ROUTINE W REFLEX MICROSCOPIC (NOT AT [facility])    Imaging Review Dg Chest 2 View  09/11/2015  CLINICAL DATA:  Acute onset of congestion and cough. Dizziness. Initial encounter. EXAM: CHEST  2 VIEW COMPARISON:  None. FINDINGS: The lungs are well-aerated and clear. There is no evidence of focal opacification, pleural effusion or pneumothorax. The heart is normal in size; the mediastinal contour is within normal limits. No acute osseous abnormalities are seen. IMPRESSION: No acute cardiopulmonary process seen. Electronically Signed   By: Roanna RaiderJeffery  Chang M.D.   On: 09/11/2015 06:36   I have personally reviewed and evaluated these images and lab results as part of my medical decision-making.   EKG Interpretation   Date/Time:  Wednesday September 11 2015 00:16:30 EST Ventricular Rate:  78 PR Interval:  144 QRS Duration: 88 QT Interval:  370 QTC Calculation: 421 R Axis:   87 Text Interpretation:  Sinus rhythm Probable left atrial enlargement ST  elev, probable  normal early repol pattern No previous ECGs available  Confirmed by [facility] MD, Brandonn Capelli ([id number]) on 09/11/2015 5:56:35 AM      MDM   Final diagnoses:  Viral URI  Lightheadedness   29yo male with no significant medical history presents with concern for cough, nasal congestion and lightheadedness. DDx for near-syncope includes cardiac arrhythmia, MI, PE, electrolyte abnormality, hypovolemia including dehydration and anemia/GI bleed, infection. No sign of anemia or electrolyte abnormality on labs.  EKG evaluated by me and shows sinus rhythm with no sign of prolonged QTc, no brugada, no sign of HOCM, no delta waves, no clear epsilon waves. PR depression/slight ST elevation likely benign early repolarization.  No CP and doubt PE/MI/pericarditis.  Mild HA and doubt SAH, no fevers doubt meningitis. Neurologic exam WNL. CXR shows no sign of pneumonia.  Patient with likely viral URI and lightheadedness associated with acute illness.  Gave rx for tessalon pearls and recommend PCP follow up. Discussed reasons to return to ED in detail. Patient discharged in stable condition with understanding of reasons to return.      Alvira MondayErin Davine Sweney, MD 09/11/15 [phone]

## 2015-09-11 NOTE — ED Notes (Signed)
Patient transported to X-ray 

## 2015-09-11 NOTE — ED Notes (Signed)
Patient states he has had a head cold for about 1.5 weeks. Two days, he developed dizziness and feeling "whooze". Pt didn't got to work last night. Was at work tonight and felt like he was going to have a syncopal episode. Denies fever. Been taking OTC cold and sinus medication.

## 2015-09-11 NOTE — ED Notes (Signed)
MD at bedside. 

## 2017-06-14 IMAGING — CR DG CHEST 2V
2 series · 2 of 2 positions shown · non-contrast
Comparison: None.

CLINICAL DATA: Acute onset of congestion and cough. Dizziness.
Initial encounter.

EXAM:
CHEST  2 VIEW

[w chest pa]
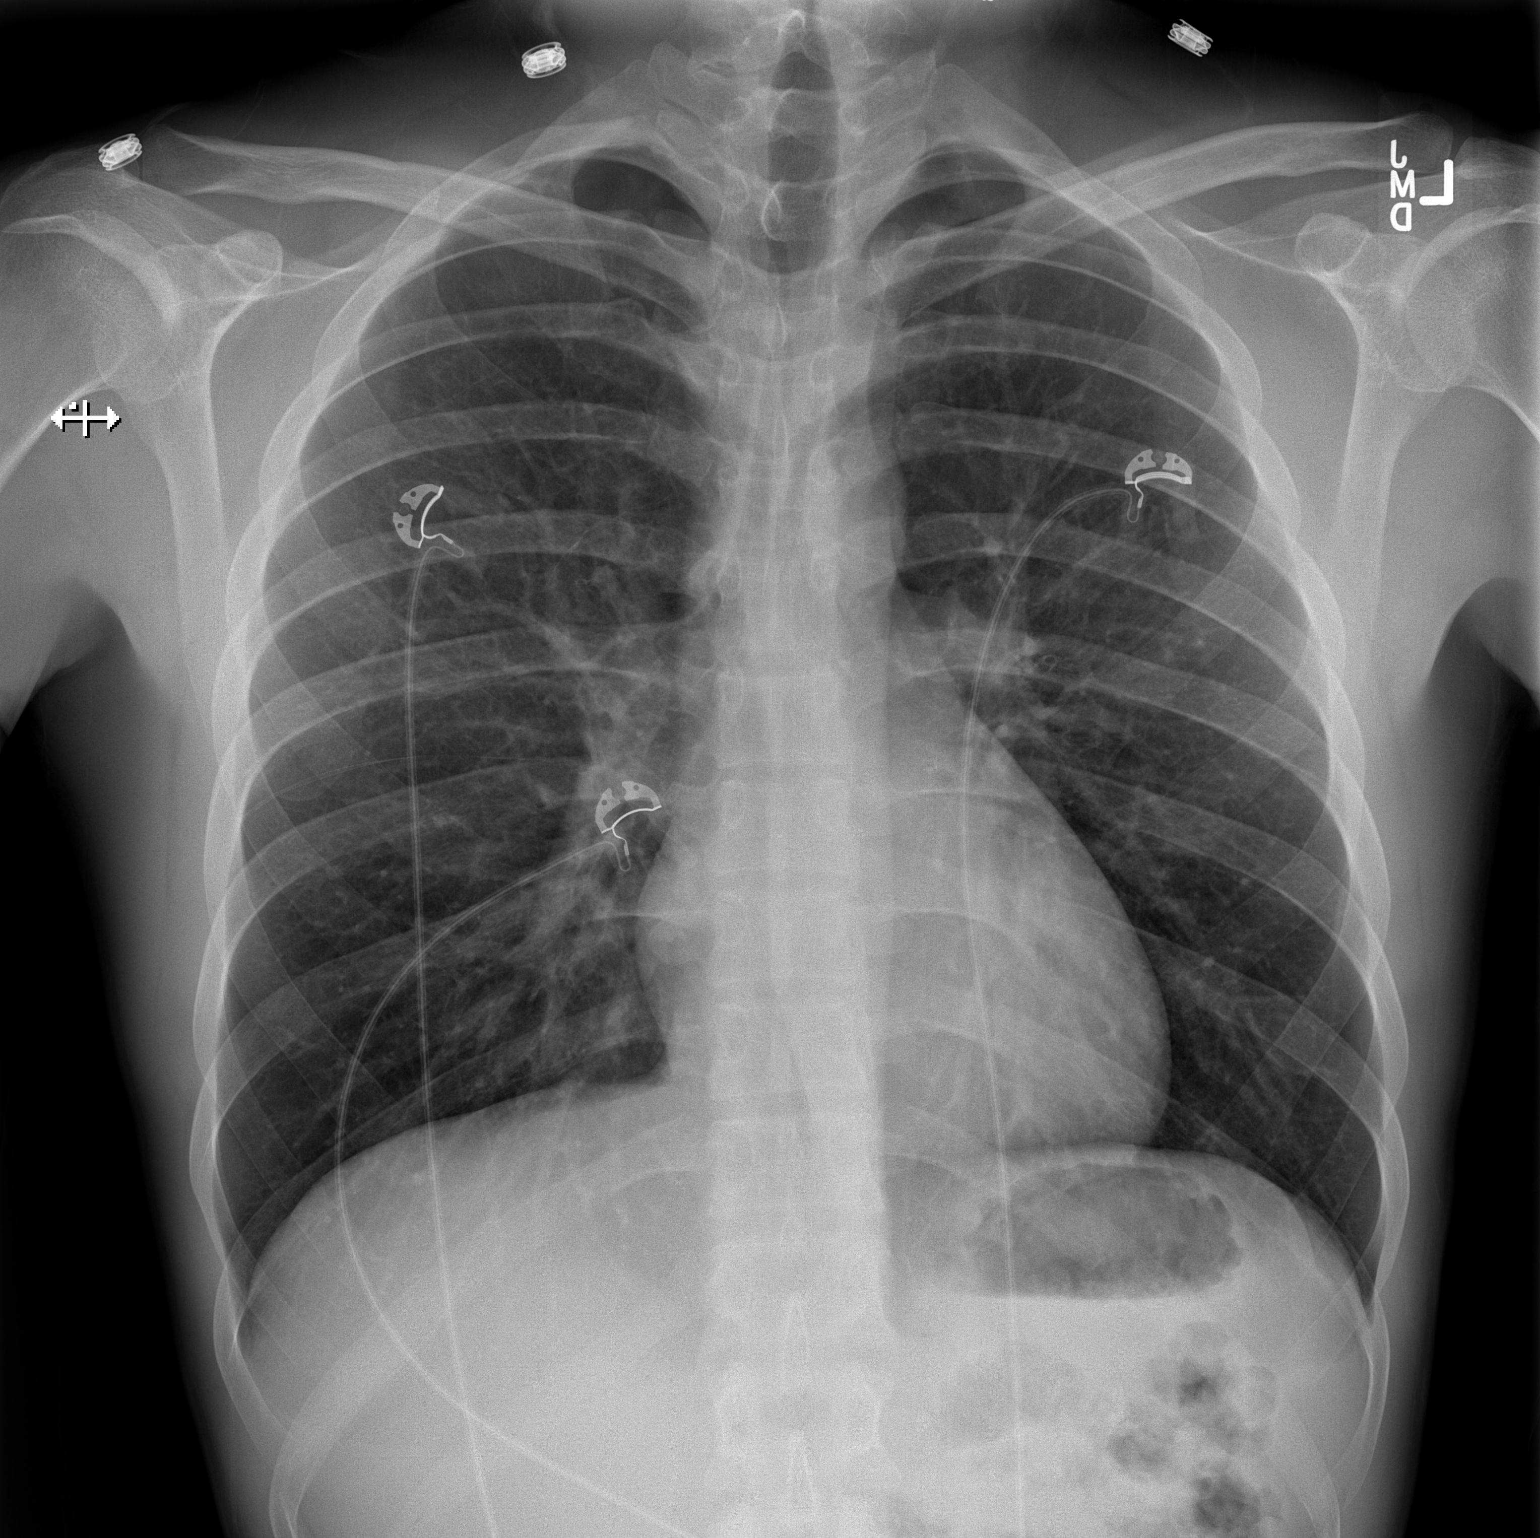

[w chest lat]
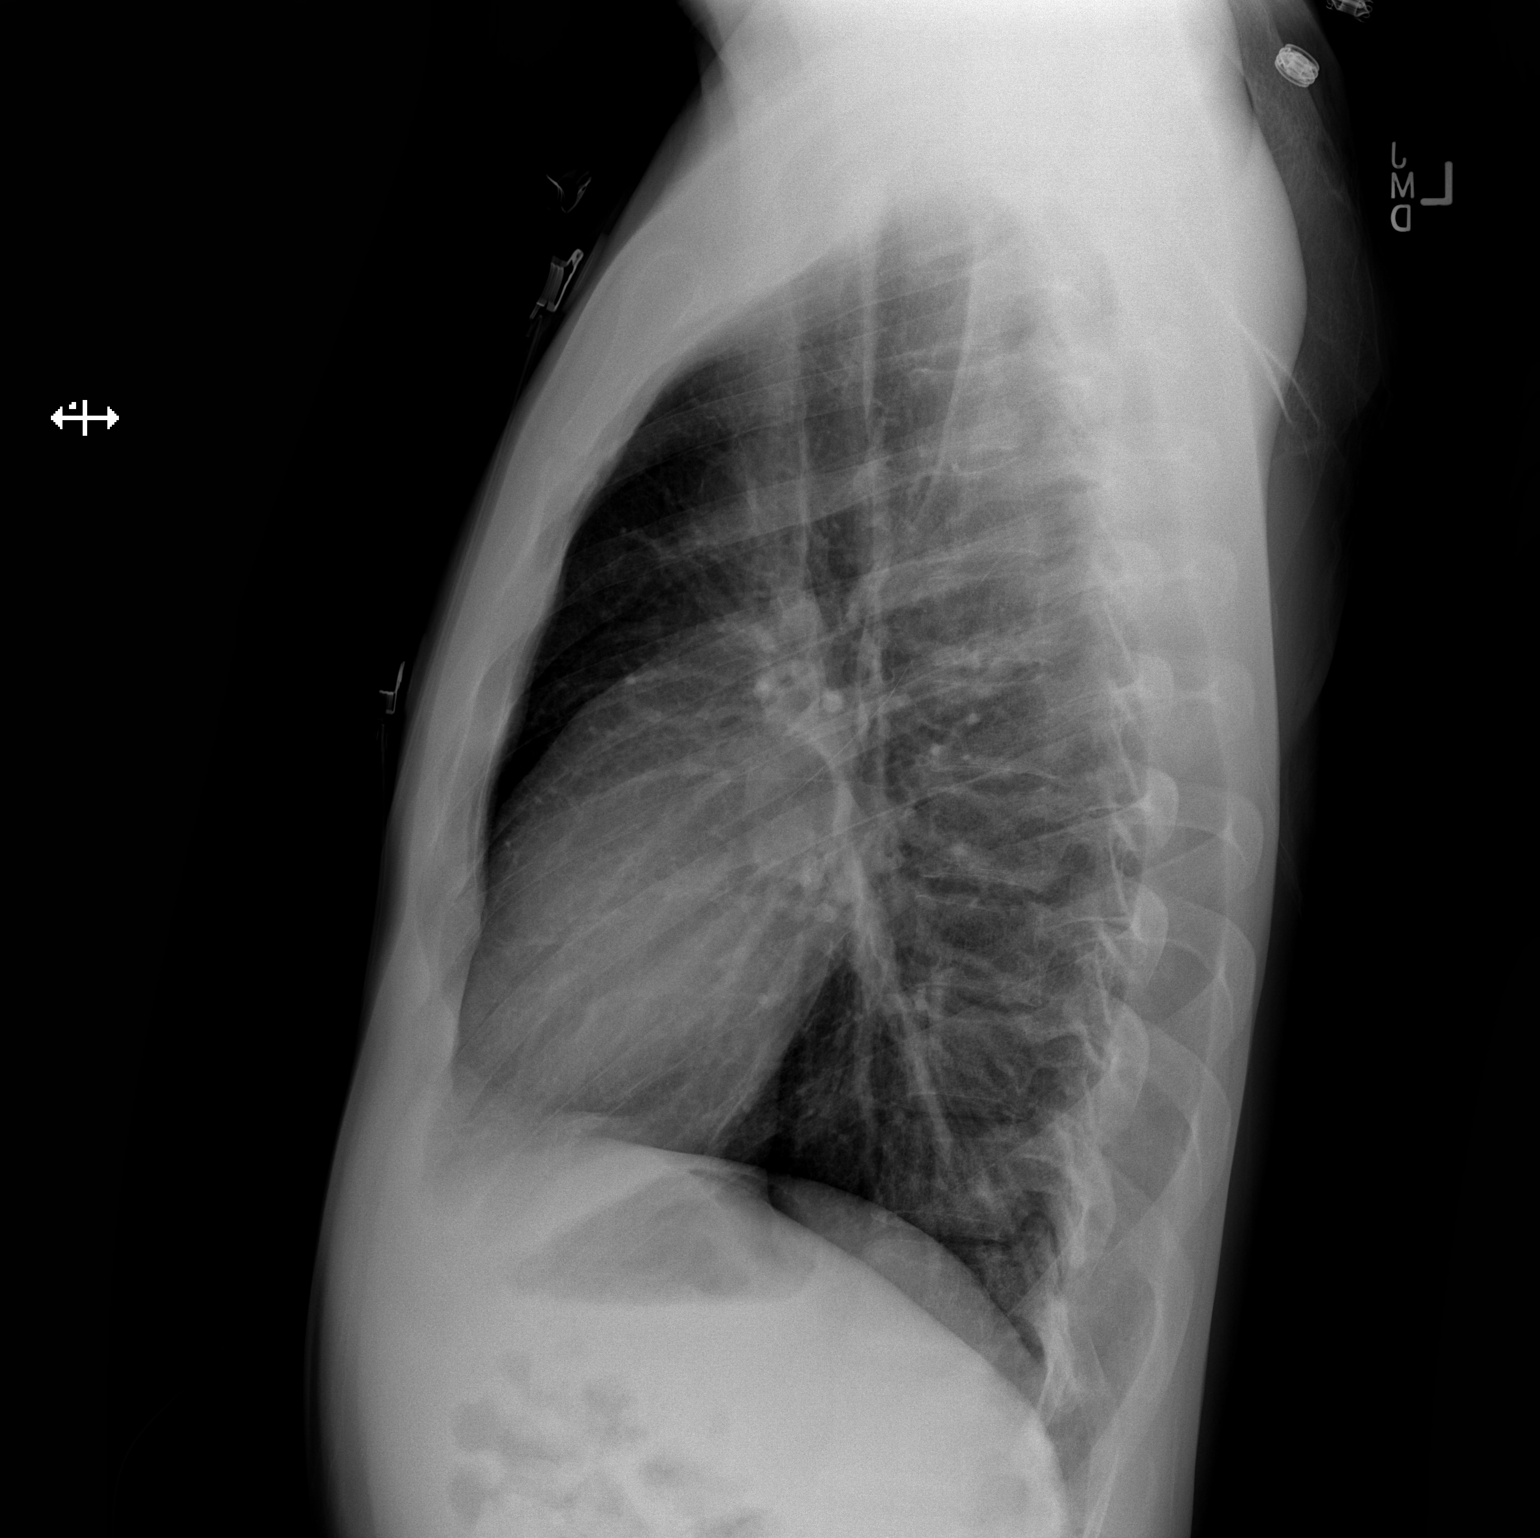

[2 of 2 positions shown; findings below may reference images not displayed]

FINDINGS: The lungs are well-aerated and clear. There is no evidence of focal
opacification, pleural effusion or pneumothorax.

The heart is normal in size; the mediastinal contour is within
normal limits. No acute osseous abnormalities are seen.
IMPRESSION: No acute cardiopulmonary process seen.

## 2019-03-26 ENCOUNTER — Other Ambulatory Visit: Payer: Self-pay

## 2019-03-26 ENCOUNTER — Emergency Department (HOSPITAL_COMMUNITY)
Admission: EM | Admit: 2019-03-26 | Discharge: 2019-03-27 | Disposition: A | Discharge status: Home / Self Care | Payer: Self-pay | Attending: Emergency Medicine | Admitting: Emergency Medicine

## 2019-03-26 ENCOUNTER — Encounter (HOSPITAL_COMMUNITY): Payer: Self-pay

## 2019-03-26 DIAGNOSIS — E860 Dehydration: Secondary | ICD-10-CM | POA: Insufficient documentation

## 2019-03-26 DIAGNOSIS — F17210 Nicotine dependence, cigarettes, uncomplicated: Secondary | ICD-10-CM | POA: Insufficient documentation

## 2019-03-26 DIAGNOSIS — R42 Dizziness and giddiness: Secondary | ICD-10-CM | POA: Insufficient documentation

## 2019-03-26 NOTE — ED Triage Notes (Signed)
Pt reports 3 episodes of dizziness over the last week. States that they happen randomly regardless of exertion. Denies chest pain or SOB. Was seen at urgent care for same earlier this week, but wants some answers.

## 2019-03-27 LAB — CBC WITH DIFFERENTIAL/PLATELET
Abs Immature Granulocytes: 0.03 10*3/uL (ref 0.00–0.07)
Basophils Absolute: 0.1 10*3/uL (ref 0.0–0.1)
Basophils Relative: 1 %
Eosinophils Absolute: 0.3 10*3/uL (ref 0.0–0.5)
Eosinophils Relative: 4 %
HCT: 49.5 % (ref 39.0–52.0)
Hemoglobin: 16.4 g/dL (ref 13.0–17.0)
Immature Granulocytes: 0 %
Lymphocytes Relative: 35 %
Lymphs Abs: 3 10*3/uL (ref 0.7–4.0)
MCH: 30.7 pg (ref 26.0–34.0)
MCHC: 33.1 g/dL (ref 30.0–36.0)
MCV: 92.5 fL (ref 80.0–100.0)
Monocytes Absolute: 0.6 10*3/uL (ref 0.1–1.0)
Monocytes Relative: 7 %
Neutro Abs: 4.6 10*3/uL (ref 1.7–7.7)
Neutrophils Relative %: 53 %
Platelets: 247 10*3/uL (ref 150–400)
RBC: 5.35 MIL/uL (ref 4.22–5.81)
RDW: 13.7 % (ref 11.5–15.5)
WBC: 8.6 10*3/uL (ref 4.0–10.5)
nRBC: 0 % (ref 0.0–0.2)

## 2019-03-27 LAB — COMPREHENSIVE METABOLIC PANEL
ALT: 16 U/L (ref 0–44)
AST: 21 U/L (ref 15–41)
Albumin: 4.5 g/dL (ref 3.5–5.0)
Alkaline Phosphatase: 97 U/L (ref 38–126)
Anion gap: 10 (ref 5–15)
BUN: 13 mg/dL (ref 6–20)
CO2: 26 mmol/L (ref 22–32)
Calcium: 9 mg/dL (ref 8.9–10.3)
Chloride: 104 mmol/L (ref 98–111)
Creatinine, Ser: 0.88 mg/dL (ref 0.61–1.24)
GFR calc Af Amer: >60 mL/min (ref >60)
GFR calc non Af Amer: >60 mL/min (ref >60)
Glucose, Bld: 96 mg/dL (ref 70–99)
Potassium: 3.7 mmol/L (ref 3.5–5.1)
Sodium: 140 mmol/L (ref 135–145)
Total Bilirubin: 0.2 mg/dL — ABNORMAL LOW (ref 0.3–1.2)
Total Protein: 7.2 g/dL (ref 6.5–8.1)

## 2019-03-27 MED ORDER — MECLIZINE HCL 25 MG PO TABS: 25.0000 mg | ORAL_TABLET | Freq: Three times a day (TID) | ORAL | 0 refills | Status: DC | PRN

## 2019-03-27 MED ORDER — MECLIZINE HCL 25 MG PO TABS
25.0000 mg | ORAL_TABLET | Freq: Once | ORAL | Status: AC
  Administered 2019-03-27: 25 mg via ORAL
  Filled 2019-03-27: qty 1

## 2019-03-27 MED ORDER — SODIUM CHLORIDE 0.9 % IV BOLUS
1000.0000 mL | Freq: Once | INTRAVENOUS | Status: AC
  Administered 2019-03-27: 1000 mL via INTRAVENOUS

## 2019-03-27 NOTE — ED Provider Notes (Signed)
[facility]   CSN: [id number] Arrival date & time: 03/26/19  2020    History   Chief Complaint Chief Complaint  Patient presents with  . Dizziness    HPI George Rice is a 33 y.o. male presenting for evaluation of intermittent dizziness.   Pt states he has had 3 episodes of dizziness in the past week. Each episode lasts about 20 seconds before resolving without intervention. After dizziness, he remains "foggy." dizziness is not always correlated with movement. Nothing specific triggers it. He denies history of similar. He denies fevers, chills, ha, nasal congestion, st, cough, sob, n/v, abd pain, urinary sxs, or abnormal BMs. [facility], but no dx was made. He states he has no medical problems, takes tylenol as needed for pain. He denies dizziness currently.     HPI  Past Medical History:  Diagnosis Date  . Bell's palsy     There are no active problems to display for this patient.   History reviewed. No pertinent surgical history.      Home Medications    Prior to Admission medications   Medication Sig Start Date End Date Taking? Authorizing Provider  amoxicillin (AMOXIL) 500 MG capsule Take 1 capsule (500 mg total) by mouth 3 (three) times daily. For 10 days Patient not taking: Reported on 09/10/2015 07/17/13   Triplett, Tammy, PA-C  benzonatate (TESSALON) 100 MG capsule Take 1 capsule (100 mg total) by mouth every 8 (eight) hours. 09/11/15   Alvira MondaySchlossman, Erin, MD  DM-Doxylamine-Acetaminophen (NYQUIL COLD & FLU PO) Take 1 capsule by mouth at bedtime.    [provider]  HYDROcodone-acetaminophen (HYCET) 7.5-325 mg/15 ml solution Take 10 mLs by mouth every 6 (six) hours as needed for pain. Patient not taking: Reported on 09/10/2015 07/17/13   Triplett, Babette Relicammy, PA-C  ibuprofen (ADVIL,MOTRIN) 200 MG tablet Take 200 mg by mouth every 6 (six) hours as needed. PAIN     [provider]  meclizine  (ANTIVERT) 25 MG tablet Take 1 tablet (25 mg total) by mouth 3 (three) times daily as needed for dizziness. 03/27/19   Trinette Vera, PA-C  predniSONE (DELTASONE) 10 MG tablet 6 tabs po QD x days 1-7. Then 5 tabs po QD day 8, 4 tabs po QD day 9, 3 tabs po QD day 10, 2 tabs po QD day 11, 1 tab po QD day 12 then stop. Patient not taking: Reported on 09/10/2015 12/26/14   Ria ClockPresson, Jennifer Lee H, PA  Pseudoephedrine-APAP-DM (DAYQUIL MULTI-SYMPTOM COLD/FLU PO) Take 1 capsule by mouth every 6 (six) hours.    [provider]    Family History History reviewed. No pertinent family history.  Social History Social History   Tobacco Use  . Smoking status: Current Every Day Smoker    Packs/day: 1.00    Types: Cigarettes  . Smokeless tobacco: Never Used  Substance Use Topics  . Alcohol use: Yes    Comment: One every month.   . Drug use: No     Allergies   Poison sumac extract   Review of Systems Review of Systems  Neurological: Positive for dizziness.  All other systems reviewed and are negative.    Physical Exam Updated Vital Signs BP 116/74 (BP Location: Right Arm)   Pulse (!) 57   Temp 98.2 F (36.8 C) (Oral)   Resp 14   Ht 6' (1.829 m)   Wt 67.6 kg   SpO2 100%   BMI 20.21 kg/m  Physical Exam Vitals signs and nursing note reviewed.  Constitutional:      General: He is not in acute distress.    Appearance: He is well-developed.     Comments: Resting comfortably in the bed in NAD  HENT:     Head: Normocephalic and atraumatic.  Eyes:     Extraocular Movements: Extraocular movements intact.     Conjunctiva/sclera: Conjunctivae normal.     Pupils: Pupils are equal, round, and reactive to light.     Comments: EOMI and PERRLA. No nystagmus  Neck:     Musculoskeletal: Normal range of motion and neck supple.  Cardiovascular:     Rate and Rhythm: Normal rate and regular rhythm.     Pulses: Normal pulses.  Pulmonary:     Effort: Pulmonary effort is normal.  No respiratory distress.     Breath sounds: Normal breath sounds. No wheezing.  Abdominal:     General: There is no distension.     Palpations: Abdomen is soft. There is no mass.     Tenderness: There is no abdominal tenderness. There is no guarding or rebound.  Musculoskeletal: Normal range of motion.  Skin:    General: Skin is warm and dry.     Capillary Refill: Capillary refill takes less than 2 seconds.  Neurological:     General: No focal deficit present.     Mental Status: He is alert and oriented to person, place, and time.     GCS: GCS eye subscore is 4. GCS verbal subscore is 5. GCS motor subscore is 6.     Cranial Nerves: Cranial nerves are intact.     Sensory: Sensation is intact.     Motor: Motor function is intact.     Coordination: Coordination is intact.     Comments: No focal deficit. Cn intant. Nose to finger intact. Fine movement and coordination intact. Strength and sensation intact x4.       ED Treatments / Results  Labs (all labs ordered are listed, but only abnormal results are displayed) Labs Reviewed  COMPREHENSIVE METABOLIC PANEL - Abnormal; Notable for the following components:      Result Value   Total Bilirubin 0.2 (*)    All other components within normal limits  CBC WITH DIFFERENTIAL/PLATELET    EKG EKG Interpretation  Date/Time:  Monday March 27 2019 01:02:11 EDT Ventricular Rate:  63 PR Interval:  172 QRS Duration: 86 QT Interval:  398 QTC Calculation: 407 R Axis:   89 Text Interpretation:  Normal sinus rhythm Early repolarization Normal ECG very similar tracing to 2016 Confirmed by Merrily Pew [id number] on 03/27/2019 2:50:04 AM   Radiology No results found.  Procedures Procedures (including critical care time)  Medications Ordered in ED Medications  sodium chloride 0.9 % bolus 1,000 mL (0 mLs Intravenous Stopped 03/27/19 0322)  meclizine (ANTIVERT) tablet 25 mg (25 mg Oral Given 03/27/19 0227)     Initial Impression / Assessment  and Plan / ED Course  I have reviewed the triage vital signs and the nursing notes.  Pertinent labs & imaging results that were available during my care of the patient were reviewed by me and considered in my medical decision making (see chart for details).        Pt presenting for evaluation of dizziness.  Physical exam reassuring, he appears nontoxic.  No focal neurologic deficits.  Dizziness is intermittent.  Low suspicion for TIA, CVA, ICH, SAH.  Consider vertigo versus dehydration versus electrolyte abnormality.  Will  obtain labs, ekg, and orthostatic and reassess.  Labs reassuring, hemoglobin stable.  Electrolytes stable.  Orthostatics show elevation of heart rate upon standing. EKG without stemi. Will given fluids, meclizine, and reassess.   Discussed findings with patient.  On reassessment, patient reports symptoms are improved.  Discussed likelihood of dehydration.  Meclizine also helped patient feel better.  Discussed as needed use.  At this time, I do not believe further imaging such as CT or MRI would be beneficial.  Encouraged follow-up with neurology if symptoms are not improving.  At this time, patient appears safe for discharge.  Return precautions given.  Patient states he understands and agrees to plan.  Final Clinical Impressions(s) / ED Diagnoses   Final diagnoses:  Dizziness  Dehydration    ED Discharge Orders         Ordered    meclizine (ANTIVERT) 25 MG tablet  3 times daily PRN     03/27/19 0251           Alveria ApleyCaccavale, Rylie Limburg, PA-C 03/27/19 [id number]    Mesner, Barbara CowerJason, MD 03/28/19 [phone]

## 2019-03-27 NOTE — Discharge Instructions (Addendum)
It is important that you are staying well-hydrated water.  Your urine should be clear to pale yellow. Use meclizine as needed for dizziness. If your symptoms persist, follow-up with neurology, offices listed below. Return to emergency room if you develop persistent dizziness, numbness, difficulty talking, inability to walk, or any new, worsening, concerning symptoms.

## 2020-06-13 ENCOUNTER — Telehealth: Payer: Self-pay | Admitting: *Deleted

## 2020-06-13 NOTE — Telephone Encounter (Signed)
Pt wants to set up a new pt appt.

## 2021-12-03 ENCOUNTER — Encounter: Payer: Self-pay | Admitting: *Deleted

## 2021-12-09 ENCOUNTER — Ambulatory Visit (INDEPENDENT_AMBULATORY_CARE_PROVIDER_SITE_OTHER): Payer: Self-pay | Admitting: Psychiatry

## 2021-12-09 ENCOUNTER — Encounter: Payer: Self-pay | Admitting: Psychiatry

## 2021-12-09 VITALS — BP 127/76 | HR 88 | Ht 72.0 in | Wt 150.0 lb

## 2021-12-09 DIAGNOSIS — R519 Headache, unspecified: Secondary | ICD-10-CM

## 2021-12-09 DIAGNOSIS — R42 Dizziness and giddiness: Secondary | ICD-10-CM

## 2021-12-09 MED ORDER — AMITRIPTYLINE HCL 25 MG PO TABS: ORAL_TABLET | ORAL | 3 refills | Status: AC

## 2021-12-09 MED ORDER — METHYLPREDNISOLONE 4 MG PO TBPK: ORAL_TABLET | ORAL | 0 refills | Status: DC

## 2021-12-09 NOTE — Patient Instructions (Signed)
Start amitriptyline for headache prevention. Take 1/2 pill at bedtime for one week, then increase to 1 pill at bedtime ?Steroid pack to help reduce headaches ?MRI brain and C-spine ?Try to limit Excedrin use to 2 days per week to avoid rebound headaches ?
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
## 2021-12-10 ENCOUNTER — Telehealth: Payer: Self-pay | Admitting: Psychiatry

## 2021-12-10 NOTE — Telephone Encounter (Signed)
self pay order sent to GI, they will reach out to the patient to schedule.  

## 2022-01-12 ENCOUNTER — Other Ambulatory Visit: Payer: Self-pay

## 2022-01-14 ENCOUNTER — Telehealth: Payer: Self-pay | Admitting: Psychiatry

## 2022-01-14 NOTE — Telephone Encounter (Signed)
Pt states amitriptyline (ELAVIL) 25 MG tablet , is for depression.  Pt would like to know why he was prescribed this medication, please call. ?

## 2022-01-14 NOTE — Telephone Encounter (Signed)
I called the the pt back and we reviewed message. He states he received notice today that his amitriptyline is ready for pick up. Question of possible back order for this. He wanted to make sure this medication was correct and was prescribed at last visit. I reviewed plan from Dr. Delena Bali and advised order was correct and he verbalized understanding. He will begin med as instructed.  ? ?He also mentioned he had to cancel his MRI's due to not feeling well and has had a hard time getting back in touch with the schedulers with [facility]. He sts he has called and the call will drop and has left vm without call back. Advised I would send a message to our coordinator on this. 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   ?

## 2022-01-15 NOTE — Telephone Encounter (Signed)
Noted,  ? ?[facility] stating on 01/12/22 PT NO SHOW PREV APPT-LMOM TO F/U TO RESCHEDULE/CLC ? ?I sent the order and asked for them to call him after 330 pm.  ?

## 2023-08-28 ENCOUNTER — Ambulatory Visit (INDEPENDENT_AMBULATORY_CARE_PROVIDER_SITE_OTHER): Payer: Self-pay

## 2023-08-28 ENCOUNTER — Ambulatory Visit
Admission: EM | Admit: 2023-08-28 | Discharge: 2023-08-28 | Disposition: A | Discharge status: Home / Self Care | Payer: Self-pay | Attending: Internal Medicine | Admitting: Internal Medicine

## 2023-08-28 DIAGNOSIS — R0781 Pleurodynia: Secondary | ICD-10-CM

## 2023-08-28 DIAGNOSIS — R0789 Other chest pain: Secondary | ICD-10-CM

## 2023-08-28 MED ORDER — KETOROLAC TROMETHAMINE 30 MG/ML IJ SOLN
30.0000 mg | Freq: Once | INTRAMUSCULAR | Status: AC
  Administered 2023-08-28: 30 mg via INTRAMUSCULAR

## 2023-08-28 MED ORDER — NAPROXEN 500 MG PO TABS: 500.0000 mg | ORAL_TABLET | Freq: Two times a day (BID) | ORAL | 0 refills | Status: AC | PRN

## 2023-08-28 MED ORDER — TIZANIDINE HCL 4 MG PO TABS: 4.0000 mg | ORAL_TABLET | Freq: Two times a day (BID) | ORAL | 0 refills | Status: AC | PRN

## 2023-08-28 NOTE — ED Triage Notes (Signed)
Patient c/o left upper side pain x1 weeks. The patient states the pain is a stabbing sensation.  Home interventions: ibuprofen, Biofreeze

## 2023-08-28 NOTE — ED Provider Notes (Addendum)
[facility]    CSN: [id number] Arrival date & time: 08/28/23  1042      History   Chief Complaint Chief Complaint  Patient presents with   Abdominal Pain    HPI George Rice is a 37 y.o. male presents for rib pain.  Patient reports 1 week ago he laid in bed on his left side and stretched and had a sharp pain on his left lower lateral ribs that has been persistent since that time.  He states it is worse with deep breathing, movement or twisting.  Denies any fall or impact injury.  Denies any shortness of breath or hemoptysis.  No swelling or bruising of the area.  No abdominal pain.  No history of rib fractures in the past.  He has been using Biofreeze and taking ibuprofen that temporarily provide some relief.  No other concerns at this time   Abdominal Pain   Past Medical History:  Diagnosis Date   Bell's palsy    Dizziness and giddiness     There are no active problems to display for this patient.   History reviewed. No pertinent surgical history.     Home Medications    Prior to Admission medications   Medication Sig Start Date End Date Taking? Authorizing Provider  naproxen (NAPROSYN) 500 MG tablet Take 1 tablet (500 mg total) by mouth 2 (two) times daily as needed. 08/28/23  Yes Radford Pax, NP  tiZANidine (ZANAFLEX) 4 MG tablet Take 1 tablet (4 mg total) by mouth 2 (two) times daily as needed for muscle spasms. 08/28/23  Yes Radford Pax, NP  amitriptyline (ELAVIL) 25 MG tablet Take 1/2 pill at bedtime for one week, then take one pill at bedtime 12/09/21   Ocie Doyne, MD    Family History History reviewed. No pertinent family history.  Social History Social History   Tobacco Use   Smoking status: Every Day    Current packs/day: 1.00    Types: Cigarettes   Smokeless tobacco: Never  Substance Use Topics   Alcohol use: Yes    Comment: One every month.    Drug use: No     Allergies   Poison sumac extract   Review of  Systems Review of Systems  Musculoskeletal:        Left upper rib pain      Physical Exam Triage Vital Signs ED Triage Vitals  Encounter Vitals Group     BP 08/28/23 1302 117/73     Systolic BP Percentile --      Diastolic BP Percentile --      Pulse Rate 08/28/23 1302 70     Resp 08/28/23 1302 18     Temp 08/28/23 1302 97.7 F (36.5 C)     Temp Source 08/28/23 1302 Oral     SpO2 08/28/23 1302 96 %     Weight --      Height --      Head Circumference --      Peak Flow --      Pain Score 08/28/23 1259 10     Pain Loc --      Pain Education --      Exclude from Growth Chart --    No data found.  Updated Vital Signs BP 117/73 (BP Location: Left Arm)   Pulse 70   Temp 97.7 F (36.5 C) (Oral)   Resp 18   SpO2 96%   Visual Acuity Right Eye Distance:   Left  Eye Distance:   Bilateral Distance:    Right Eye Near:   Left Eye Near:    Bilateral Near:     Physical Exam Vitals and nursing note reviewed.  Constitutional:      General: He is not in acute distress.    Appearance: Normal appearance. He is not ill-appearing.  HENT:     Head: Normocephalic and atraumatic.  Eyes:     Pupils: Pupils are equal, round, and reactive to light.  Cardiovascular:     Rate and Rhythm: Normal rate and regular rhythm.     Heart sounds: Normal heart sounds.  Pulmonary:     Effort: Pulmonary effort is normal.     Breath sounds: Normal breath sounds.  Abdominal:     Tenderness: There is no abdominal tenderness.  Musculoskeletal:       Arms:     Comments: There is no swelling, ecchymosis, deformity of the left ribs.  Pinpoint tenderness palpation to the lower left lateral ribs without step-off.  Equal chest expansion bilaterally.  Neurological:     Mental Status: He is alert.      UC Treatments / Results  Labs (all labs ordered are listed, but only abnormal results are displayed) Labs Reviewed - No data to display  EKG   Radiology DG Ribs Unilateral W/Chest  Left Result Date: 08/28/2023 CLINICAL DATA:  Left rib pain for 1 week.  No known injury. EXAM: LEFT RIBS AND CHEST - 3+ VIEW COMPARISON:  09/11/2015. FINDINGS: Bilateral lung fields are clear. Bilateral costophrenic angles are clear. Normal cardio-mediastinal silhouette. No acute osseous abnormalities. No acute rib fracture.  No focal rib lesion. The soft tissues are within normal limits. IMPRESSION: *No acute cardiopulmonary abnormality. *No acute rib fracture.  No focal rib lesion. Electronically Signed   By: Jules Schick M.D.   On: 08/28/2023 14:04    Procedures Procedures (including critical care time)  Medications Ordered in UC Medications  ketorolac (TORADOL) 30 MG/ML injection 30 mg (30 mg Intramuscular Given 08/28/23 1409)    Initial Impression / Assessment and Plan / UC Course  I have reviewed the triage vital signs and the nursing notes.  Pertinent labs & imaging results that were available during my care of the patient were reviewed by me and considered in my medical decision making (see chart for details).     Reviewed exam and symptoms with patient.  No red flags.  Wet read of x-ray without obvious fracture and no pneumothorax.  Discussed likely musculoskeletal cause of symptoms.  Toradol injection given in clinic.  Patient monitored for 10 minutes after injection with no reaction noted and tolerated well.  He was instructed no NSAIDs for 24 hours and verbalized understanding.  Will do trial of Flexeril, side effect profile reviewed.  He can start naproxen twice daily as needed tomorrow, 12/15.  Heat and rest to the area.  PCP follow-up if symptoms do not improve.  ER precautions reviewed. Final Clinical Impressions(s) / UC Diagnoses   Final diagnoses:  Rib pain on left side  Chest wall pain     Discharge Instructions      You were given a Toradol injection in clinic today. Do not take any over the counter NSAID's such as Advil, ibuprofen, Aleve, or naproxen for 24  hours. You may take tylenol if needed.  May take tizanidine twice daily as needed for muscle pain/spasm.  Please note this medication can make you drowsy.  Do not drink alcohol or drive while on  this medication.  You may start naproxen twice daily as needed tomorrow, 12/15.  Heat and rest.  Please follow-up with your PCP if your symptoms do not improve.  Please go to the ER for any worsening symptoms.  I hope you feel better soon!      ED Prescriptions     Medication Sig Dispense Auth. Provider   naproxen (NAPROSYN) 500 MG tablet Take 1 tablet (500 mg total) by mouth 2 (two) times daily as needed. 14 tablet Radford Pax, NP   tiZANidine (ZANAFLEX) 4 MG tablet Take 1 tablet (4 mg total) by mouth 2 (two) times daily as needed for muscle spasms. 10 tablet Radford Pax, NP      PDMP not reviewed this encounter.   Radford Pax, NP 08/28/23 1429    Radford Pax, NP 08/28/23 [phone]

## 2023-08-28 NOTE — Discharge Instructions (Addendum)
You were given a Toradol injection in clinic today. Do not take any over the counter NSAID's such as Advil, ibuprofen, Aleve, or naproxen for 24 hours. You may take tylenol if needed.  May take tizanidine twice daily as needed for muscle pain/spasm.  Please note this medication can make you drowsy.  Do not drink alcohol or drive while on this medication.  You may start naproxen twice daily as needed tomorrow, 12/15.  Heat and rest.  Please follow-up with your PCP if your symptoms do not improve.  Please go to the ER for any worsening symptoms.  I hope you feel better soon!
